# Patient Record
Sex: Male | Born: 1970 | Race: White | Hispanic: No | Marital: Married
Health system: Southern US, Community
[De-identification: ages and names within clinical notes are randomized; demographics above are authoritative.]

---

## 2014-07-07 ENCOUNTER — Other Ambulatory Visit: Payer: Self-pay | Admitting: Family Medicine

## 2014-07-07 ENCOUNTER — Ambulatory Visit
Admission: RE | Admit: 2014-07-07 | Discharge: 2014-07-07 | Disposition: A | Discharge status: Home / Self Care | Payer: BC Managed Care – PPO | Source: Ambulatory Visit | Attending: Family Medicine | Admitting: Family Medicine

## 2014-07-07 DIAGNOSIS — R52 Pain, unspecified: Secondary | ICD-10-CM

## 2015-09-04 IMAGING — CR DG KNEE 1-2V*L*
2 series · 2 of 2 positions shown · non-contrast
Comparison: None.

CLINICAL DATA: Knee pain, chronic, without injury

EXAM:
LEFT KNEE - 1-2 VIEW

[w knee ap left]
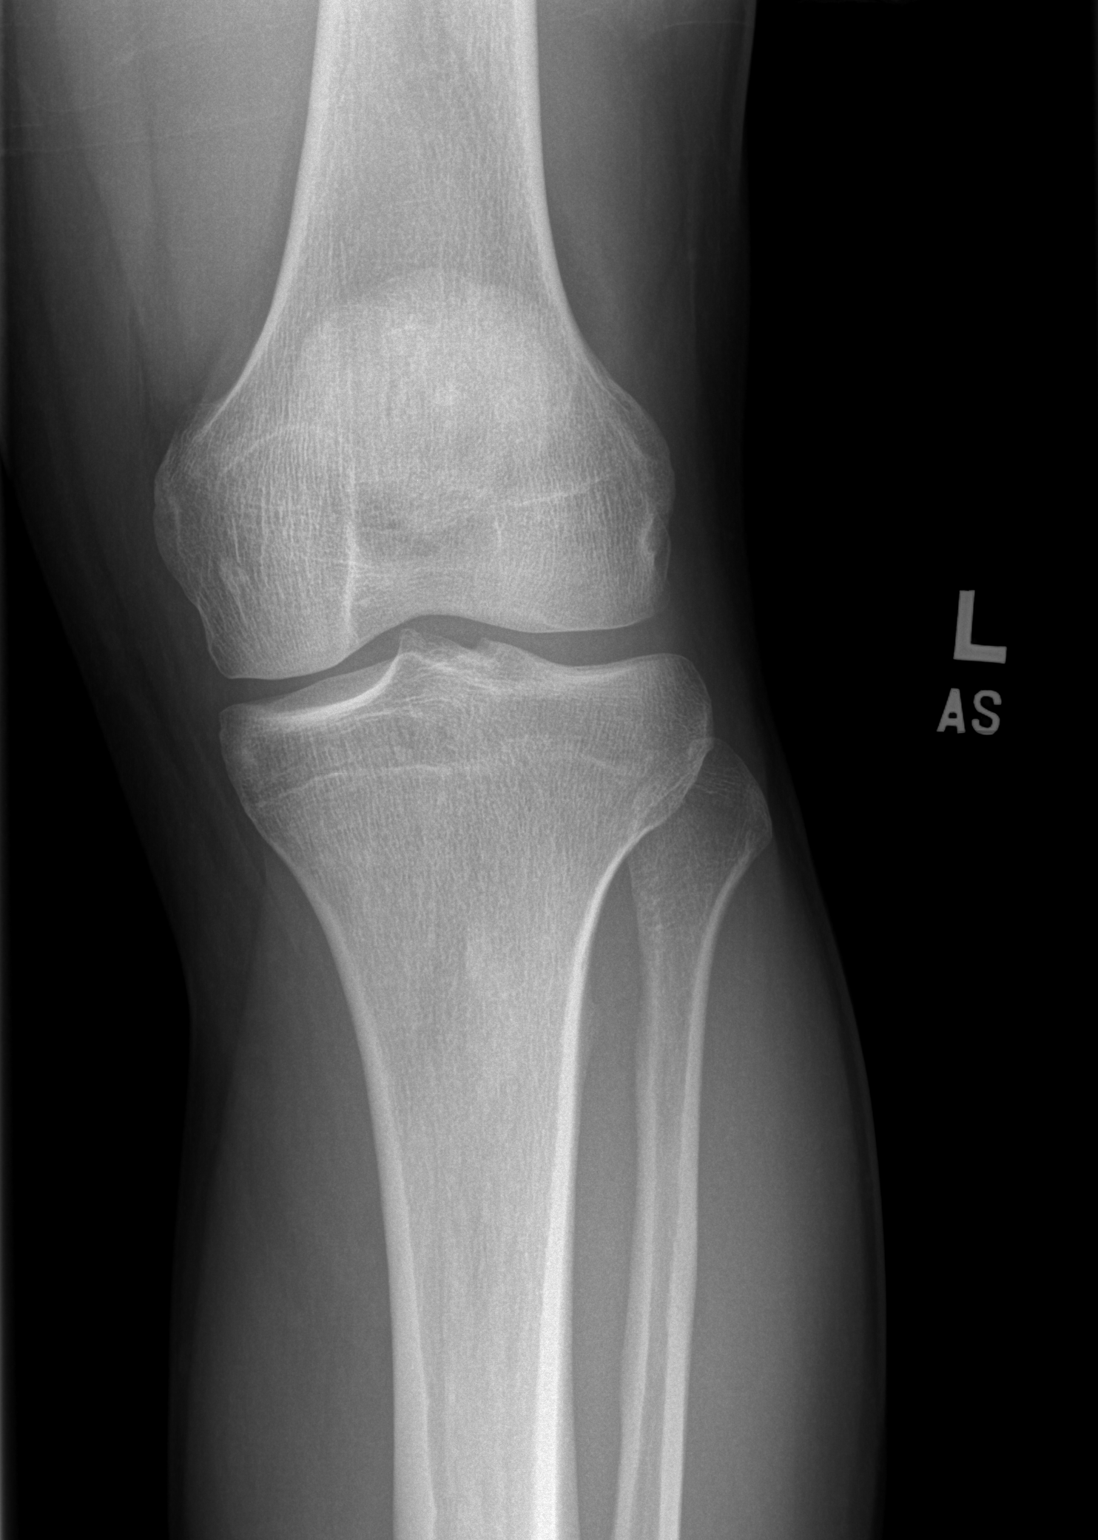

[w knee lat. left]
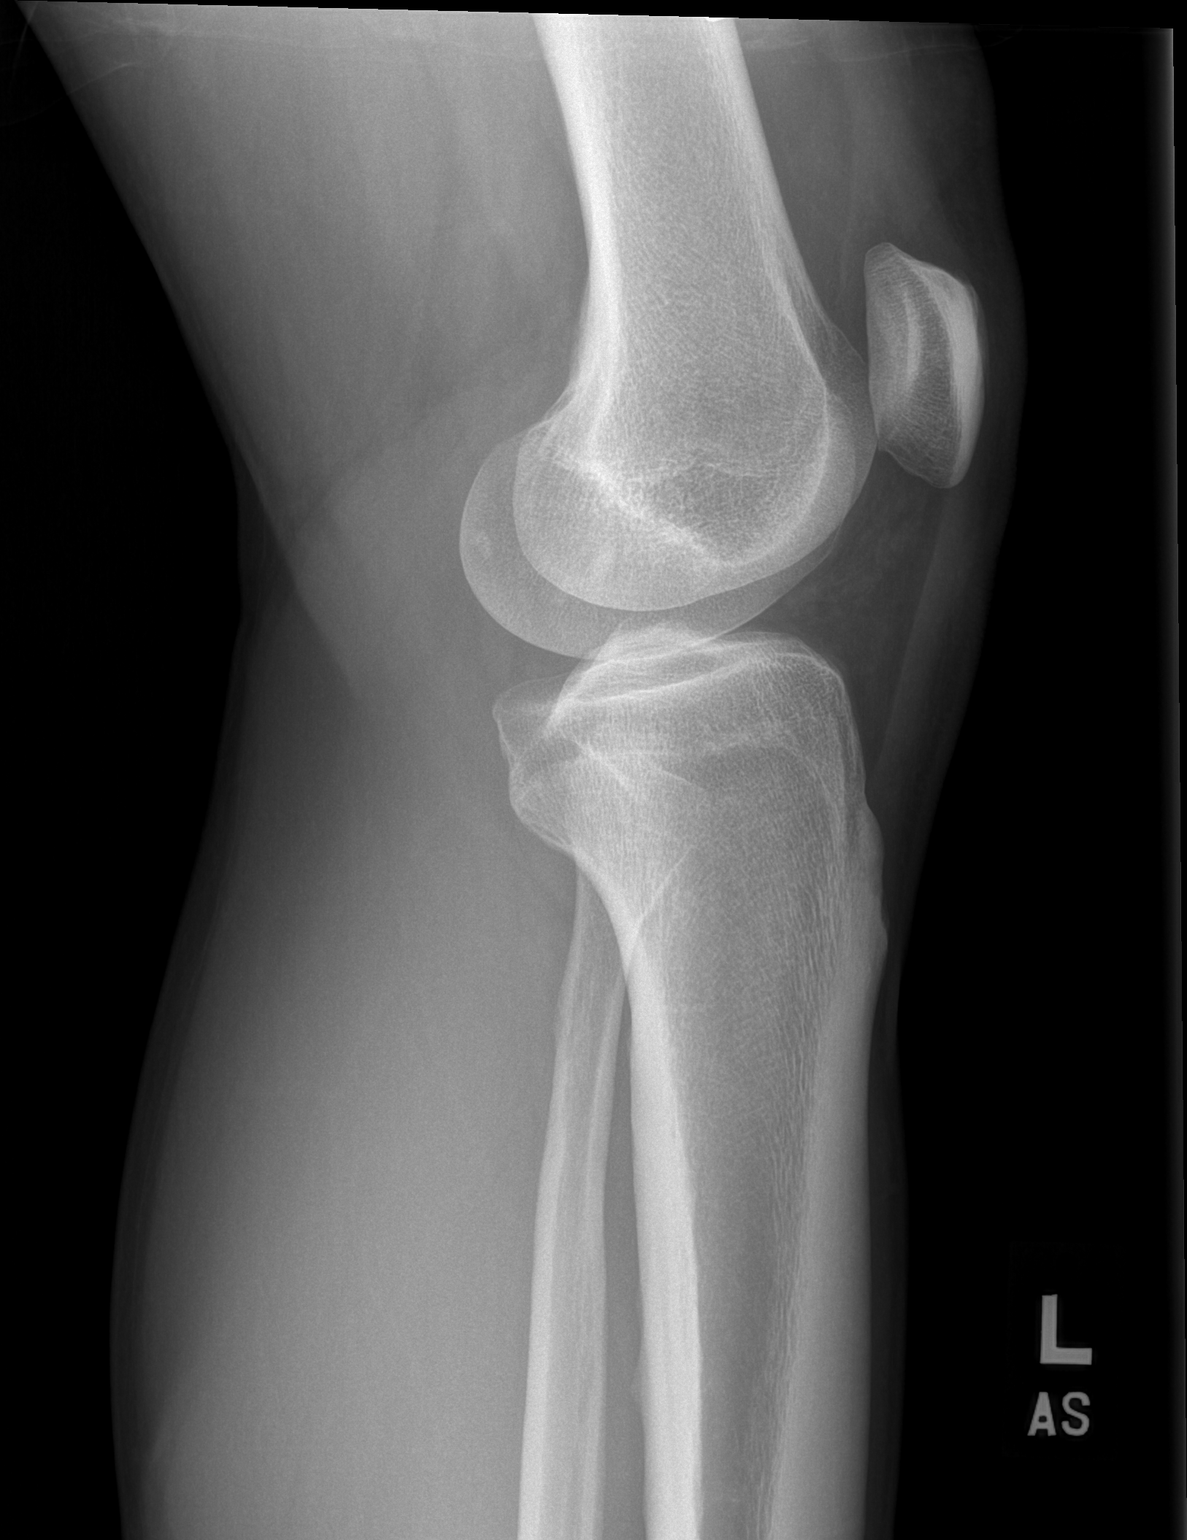

[2 of 2 positions shown; findings below may reference images not displayed]

FINDINGS: Frontal and lateral views were obtained. No fracture, dislocation,
or effusion. Joint spaces appear intact. No erosive change or
intra-articular calcification.
IMPRESSION: No fracture or effusion.  No appreciable arthropathy.
